# Patient Record
Sex: Male | Born: 2005 | Race: White | Hispanic: Yes | Marital: Single | Smoking: Never smoker
Health system: Southern US, Community
[De-identification: ages and names within clinical notes are randomized; demographics above are authoritative.]

---
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
## 2023-09-20 ENCOUNTER — Other Ambulatory Visit: Payer: Self-pay

## 2023-09-20 ENCOUNTER — Emergency Department (HOSPITAL_COMMUNITY)
Admission: EM | Admit: 2023-09-20 | Discharge: 2023-09-20 | Disposition: A | Discharge status: Home / Self Care | Payer: Medicaid Other

## 2023-09-20 ENCOUNTER — Encounter (HOSPITAL_COMMUNITY): Payer: Self-pay | Admitting: Emergency Medicine

## 2023-09-20 DIAGNOSIS — R000 Tachycardia, unspecified: Secondary | ICD-10-CM | POA: Insufficient documentation

## 2023-09-20 DIAGNOSIS — B085 Enteroviral vesicular pharyngitis: Secondary | ICD-10-CM | POA: Insufficient documentation

## 2023-09-20 DIAGNOSIS — F17200 Nicotine dependence, unspecified, uncomplicated: Secondary | ICD-10-CM | POA: Insufficient documentation

## 2023-09-20 DIAGNOSIS — Z1152 Encounter for screening for COVID-19: Secondary | ICD-10-CM | POA: Diagnosis not present

## 2023-09-20 DIAGNOSIS — K1370 Unspecified lesions of oral mucosa: Secondary | ICD-10-CM | POA: Diagnosis present

## 2023-09-20 LAB — CBC WITH DIFFERENTIAL/PLATELET
Abs Immature Granulocytes: 0.02 10*3/uL (ref 0.00–0.07)
Basophils Absolute: 0 10*3/uL (ref 0.0–0.1)
Basophils Relative: 0 %
Eosinophils Absolute: 0 10*3/uL (ref 0.0–0.5)
Eosinophils Relative: 0 %
HCT: 51.1 % (ref 39.0–52.0)
Hemoglobin: 17.5 g/dL — ABNORMAL HIGH (ref 13.0–17.0)
Immature Granulocytes: 0 %
Lymphocytes Relative: 22 %
Lymphs Abs: 1.8 10*3/uL (ref 0.7–4.0)
MCH: 28.1 pg (ref 26.0–34.0)
MCHC: 34.2 g/dL (ref 30.0–36.0)
MCV: 82 fL (ref 80.0–100.0)
Monocytes Absolute: 1.2 10*3/uL — ABNORMAL HIGH (ref 0.1–1.0)
Monocytes Relative: 14 %
Neutro Abs: 5.3 10*3/uL (ref 1.7–7.7)
Neutrophils Relative %: 64 %
Platelets: 211 10*3/uL (ref 150–400)
RBC: 6.23 MIL/uL — ABNORMAL HIGH (ref 4.22–5.81)
RDW: 12.9 % (ref 11.5–15.5)
WBC: 8.4 10*3/uL (ref 4.0–10.5)
nRBC: 0 % (ref 0.0–0.2)

## 2023-09-20 LAB — BASIC METABOLIC PANEL
Anion gap: 16 — ABNORMAL HIGH (ref 5–15)
BUN: 9 mg/dL (ref 6–20)
CO2: 21 mmol/L — ABNORMAL LOW (ref 22–32)
Calcium: 8.7 mg/dL — ABNORMAL LOW (ref 8.9–10.3)
Chloride: 98 mmol/L (ref 98–111)
Creatinine, Ser: 0.97 mg/dL (ref 0.61–1.24)
GFR, Estimated: >60 mL/min (ref >60)
Glucose, Bld: 97 mg/dL (ref 70–99)
Potassium: 3.3 mmol/L — ABNORMAL LOW (ref 3.5–5.1)
Sodium: 135 mmol/L (ref 135–145)

## 2023-09-20 LAB — RESP PANEL BY RT-PCR (RSV, FLU A&B, COVID)  RVPGX2
Influenza A by PCR: NEGATIVE
Influenza B by PCR: NEGATIVE
Resp Syncytial Virus by PCR: NEGATIVE
SARS Coronavirus 2 by RT PCR: NEGATIVE

## 2023-09-20 MED ORDER — ACETAMINOPHEN 500 MG PO TABS
1000.0000 mg | ORAL_TABLET | Freq: Once | ORAL | Status: AC
  Administered 2023-09-20: 1000 mg via ORAL
  Filled 2023-09-20: qty 2

## 2023-09-20 MED ORDER — LIDOCAINE VISCOUS HCL 2 % MT SOLN: 15.0000 mL | OROMUCOSAL | 0 refills | Status: AC | PRN

## 2023-09-20 MED ORDER — ACETAMINOPHEN 500 MG PO TABS: 500.0000 mg | ORAL_TABLET | Freq: Four times a day (QID) | ORAL | 0 refills | Status: AC | PRN

## 2023-09-20 MED ORDER — LIDOCAINE VISCOUS HCL 2 % MT SOLN
15.0000 mL | Freq: Once | OROMUCOSAL | Status: AC
  Administered 2023-09-20: 15 mL via OROMUCOSAL
  Filled 2023-09-20: qty 15

## 2023-09-20 NOTE — ED Provider Triage Note (Signed)
 Emergency Medicine Provider Triage Evaluation Note  Brian Calhoun , a 18 y.o. male  was evaluated in triage.  Pt complains of generalized ulcers/sores on his mucosal membranes.  Also endorses recent fever.  Review of Systems  Positive: As above Negative: As above  Physical Exam  There were no vitals taken for this visit. Gen:   Awake, no distress   Resp:  Normal effort  MSK:   Moves extremities without difficulty  Other:    Medical Decision Making  Medically screening exam initiated at 4:14 PM.  Appropriate orders placed.  Thompson Mckim was informed that the remainder of the evaluation will be completed by another provider, this initial triage assessment does not replace that evaluation, and the importance of remaining in the ED until their evaluation is complete.    Hildegard Loge, PA-C 09/20/23 1615

## 2023-09-20 NOTE — ED Triage Notes (Signed)
 Pt here for multiple sores on the inside of his mouth, noted to the inner lip and multiple noted on tongue.

## 2023-09-20 NOTE — ED Provider Notes (Signed)
 [place] EMERGENCY DEPARTMENT AT [facility] Provider Note   CSN: [id number] Arrival date & time: 09/20/23  1608     History  Chief Complaint  Patient presents with   Mouth Lesions    Brian Calhoun is a 18 y.o. male.  The history is provided by the patient and medical records. No language interpreter was used.  Mouth Lesions    18 yo M who presents complaining of a 5 day history of fever and sores on his mouth. The patient reports that the spots appeared out of nowhere. He describes them as painful and make it difficult for him to eat. He never took an official temperature but has been experiencing chills and sweats as well. Yesterday he tried to eat a banana, rice, and beans with no success. He has only tried ibuprofen last night for the pain that did not help. Nothing seems to make the pain better. Patient reports that even talking and water irritate his mouth. Patient denies any cough, sore throat, shortness of breath, or other skin changes outside of his mouth. Patient denies using any new products in his mouth or new foods. Patient says he has never experienced anything like this before.   The patient is sexually active with one male partner. They participate in oral and vaginal sex. He reports that they do not use condoms because she is on birth control. He does not know of anyone else who is experiencing the same thing. The patient smokes SBD cartridges every other day, but does not smoke cigarettes.   Home Medications Prior to Admission medications   Not on File      Allergies    Patient has no known allergies.    Review of Systems   Review of Systems  HENT:  Positive for mouth sores.   All other systems reviewed and are negative.   Physical Exam Updated Vital Signs BP (!) 138/91 (BP Location: Left Arm)   Pulse (!) 102   Temp (!) 100.4 F (38 C) (Oral)   Resp 16   Ht 5' 8 (1.727 m)   Wt 81.6 kg   SpO2 98%   BMI 27.37 kg/m   Physical Exam Constitutional:      General: He is not in acute distress.    Appearance: He is not ill-appearing.  HENT:     Head: Normocephalic and atraumatic.     Nose: Nose normal.     Mouth/Throat:     Comments: Multiple scattered ulcerate lesions noted throughout the tongue and mucosa without posterior oropharyngeal involvement.  No stridor no trismus. Neck:     Vascular: No carotid bruit.  Cardiovascular:     Rate and Rhythm: Tachycardia present.     Pulses: Normal pulses.     Heart sounds: Normal heart sounds.  Pulmonary:     Effort: Pulmonary effort is normal.     Breath sounds: Normal breath sounds. No wheezing, rhonchi or rales.  Abdominal:     Palpations: Abdomen is soft.     Tenderness: There is no abdominal tenderness.  Musculoskeletal:        General: Normal range of motion.     Cervical back: Normal range of motion and neck supple. No rigidity or tenderness.  Lymphadenopathy:     Cervical: Cervical adenopathy present.  Neurological:     Mental Status: He is alert and oriented to person, place, and time.  Psychiatric:        Mood and Affect: Mood normal.  ED Results / Procedures / Treatments   Labs (all labs ordered are listed, but only abnormal results are displayed) Labs Reviewed  CBC WITH DIFFERENTIAL/PLATELET - Abnormal; Notable for the following components:      Result Value   RBC 6.23 (*)    Hemoglobin 17.5 (*)    Monocytes Absolute 1.2 (*)    All other components within normal limits  BASIC METABOLIC PANEL - Abnormal; Notable for the following components:   Potassium 3.3 (*)    CO2 21 (*)    Calcium 8.7 (*)    Anion gap 16 (*)    All other components within normal limits  RESP PANEL BY RT-PCR (RSV, FLU A&B, COVID)  [id number]    EKG None  Radiology No results found.  Procedures Procedures    Medications Ordered in ED Medications - No data to display  ED Course/ Medical Decision Making/ A&P                                  Medical Decision Making  BP (!) 138/91 (BP Location: Left Arm)   Pulse (!) 102   Temp (!) 100.4 F (38 C) (Oral)   Resp 16   Ht 5' 8 (1.727 m)   Wt 81.6 kg   SpO2 98%   BMI 27.37 kg/m   10:40 PM 18 yo M who presents complaining of a 5 day history of fever and sores on his mouth. The patient reports that the spots appeared out of nowhere. He describes them as painful and make it difficult for him to eat. He never took an official temperature but has been experiencing chills and sweats as well. Yesterday he tried to eat a banana, rice, and beans with no success. He has only tried ibuprofen last night for the pain that did not help. Nothing seems to make the pain better. Patient reports that even talking and water irritate his mouth. Patient denies any cough, sore throat, shortness of breath, or other skin changes outside of his mouth. Patient denies using any new products in his mouth or new foods. Patient says he has never experienced anything like this before.   The patient is sexually active with one male partner. They participate in oral and vaginal sex. He reports that they do not use condoms because she is on birth control. He does not know of anyone else who is experiencing the same thing. The patient smokes SBD cartridges every other day, but does not smoke cigarettes.   Exam notable for multiple scattered shallow ulcerated lesions measuring from 1 mm to 3 mm throughout the tongue and buccal mucosa without any posterior oropharyngeal involvement.  No airway compromise.  Patient overall well-appearing.  Mildly tachycardic and skin is warm to the touch.  Patient presentation is likely consistent with herpangina.  DDx: Canker sores, herpes simplex, Elspeth Louder syndrome, syphilis, HIV were considered but less likely.  Will provide supportive care, return precaution given.  Patient given Tylenol  and viscous lidocaine  with improvement of symptoms.          Final Clinical  Impression(s) / ED Diagnoses Final diagnoses:  Herpangina    Rx / DC Orders ED Discharge Orders          Ordered    acetaminophen  (TYLENOL ) 500 MG tablet  Every 6 hours PRN        09/20/23 2256    lidocaine  (XYLOCAINE ) 2 % solution  As  needed        09/20/23 2256              Brian Colon, PA-C 09/20/23 2306    Ula Prentice SAUNDERS, MD 09/20/23 [phone]

## 2023-09-20 NOTE — Discharge Instructions (Addendum)
 A common childhood illness often caused by group A coxsackie viruses. Herpangina is a viral illness that's most often seen in children ages three to 39, but it can occur at any age. Sores appear in the mouth and throat. They usually have a white base and a red border and may be very painful. Fever, headache, sore throat, or difficulty swallowing may occur. Treatment involves rest, drinking fluids, and pain relievers.
